# Patient Record
Sex: Male | Born: 1970 | Race: Black or African American | Hispanic: No | Marital: Married | Smoking: Heavy tobacco smoker
Health system: Southern US, Community
[De-identification: ages and names within clinical notes are randomized; demographics above are authoritative.]

## PROBLEM LIST (undated history)

## (undated) DIAGNOSIS — K219 Gastro-esophageal reflux disease without esophagitis: Secondary | ICD-10-CM

## (undated) DIAGNOSIS — I10 Essential (primary) hypertension: Secondary | ICD-10-CM

## (undated) DIAGNOSIS — T7840XA Allergy, unspecified, initial encounter: Secondary | ICD-10-CM

---

## 2005-10-24 ENCOUNTER — Ambulatory Visit: Payer: Self-pay | Admitting: Unknown Physician Specialty

## 2005-10-26 ENCOUNTER — Emergency Department: Payer: Self-pay | Admitting: Emergency Medicine

## 2005-11-12 ENCOUNTER — Emergency Department: Payer: Self-pay | Admitting: Unknown Physician Specialty

## 2005-11-14 ENCOUNTER — Emergency Department: Payer: Self-pay | Admitting: Emergency Medicine

## 2008-09-12 ENCOUNTER — Emergency Department: Payer: Self-pay | Admitting: Emergency Medicine

## 2008-09-13 ENCOUNTER — Emergency Department: Payer: Self-pay | Admitting: Emergency Medicine

## 2009-04-30 ENCOUNTER — Emergency Department: Payer: Self-pay

## 2010-06-15 IMAGING — CR DG CHEST 2V
1 series · 2 of 2 positions shown · non-contrast
Comparison: none

REASON FOR EXAM: pain with breathing
COMMENTS:

PROCEDURE:     DXR - DXR CHEST PA (OR AP) AND LATERAL  - September 12, 2008 [DATE]
RESULT:     The patient has taken a shallow inspiration.
The lungs are clear.  The cardiac silhouette and visualized bony skeleton
are unremarkable.

[Series 1: view not recorded · 0.17mm/px · 2 of 2 slices shown]
[im 1/2]
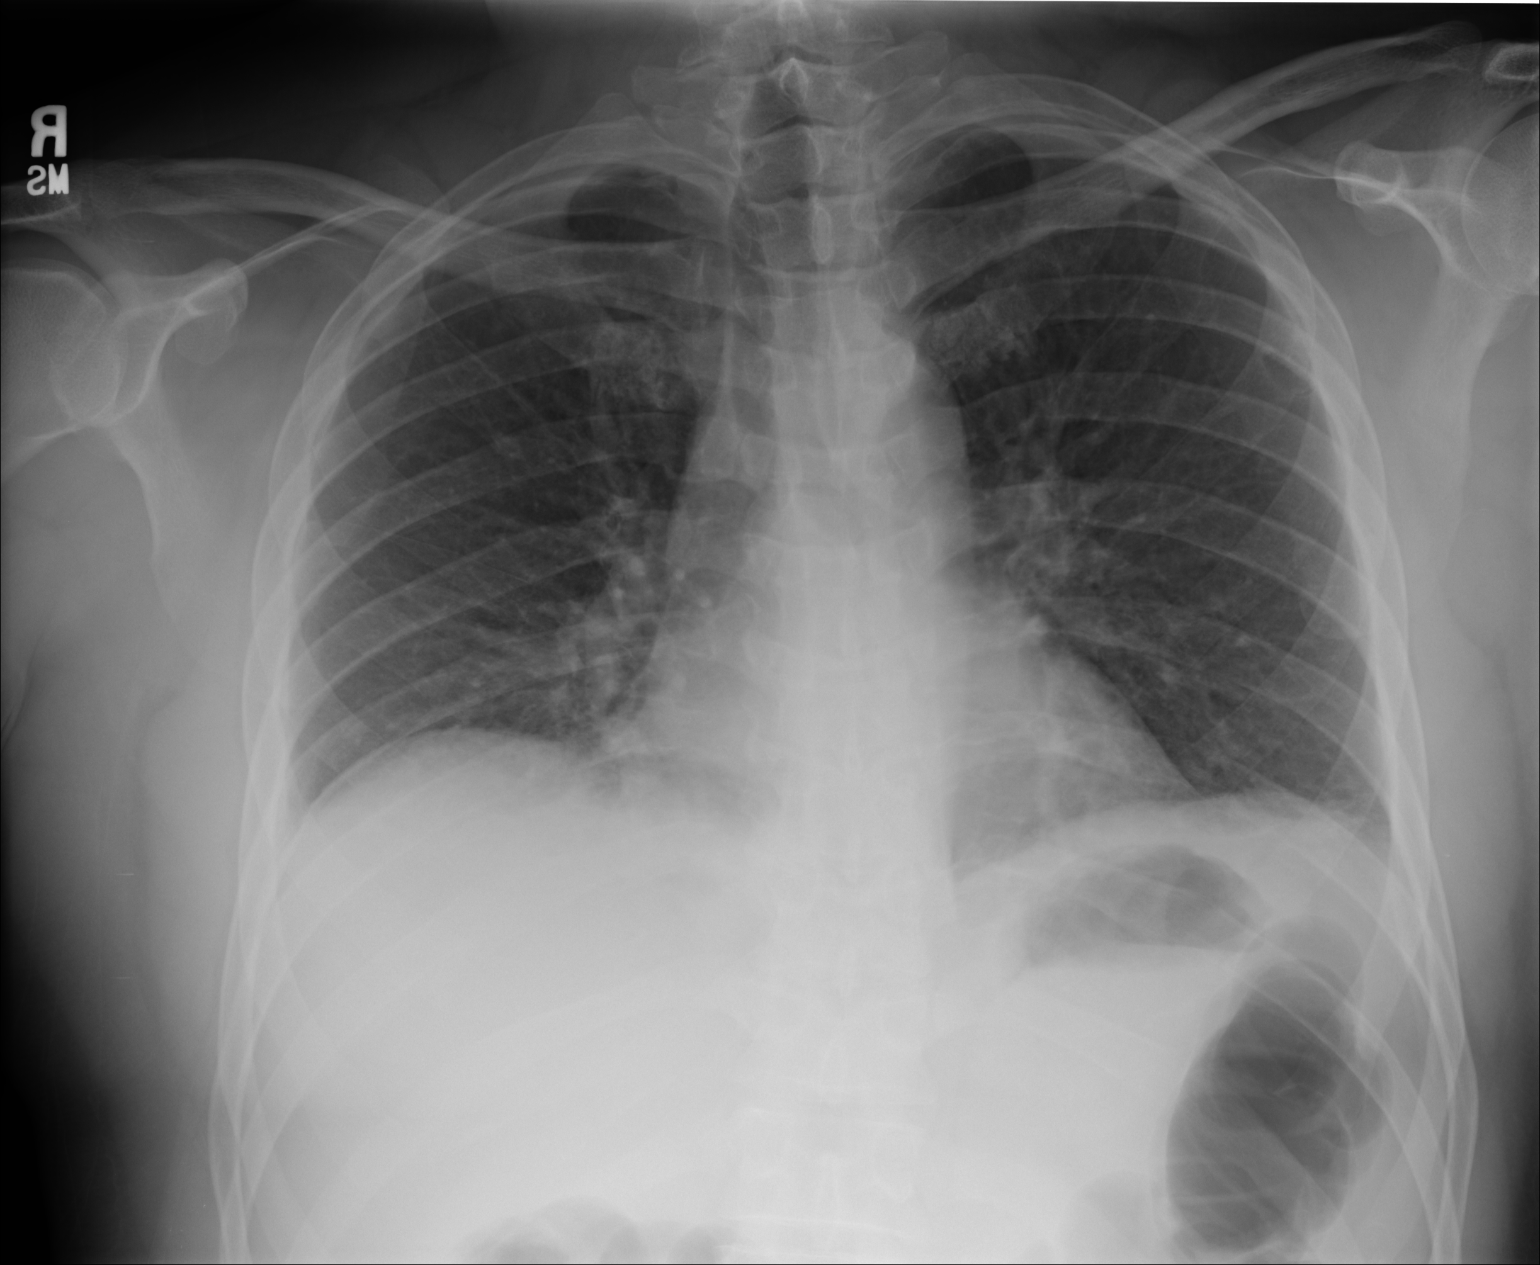
[im 2/2]
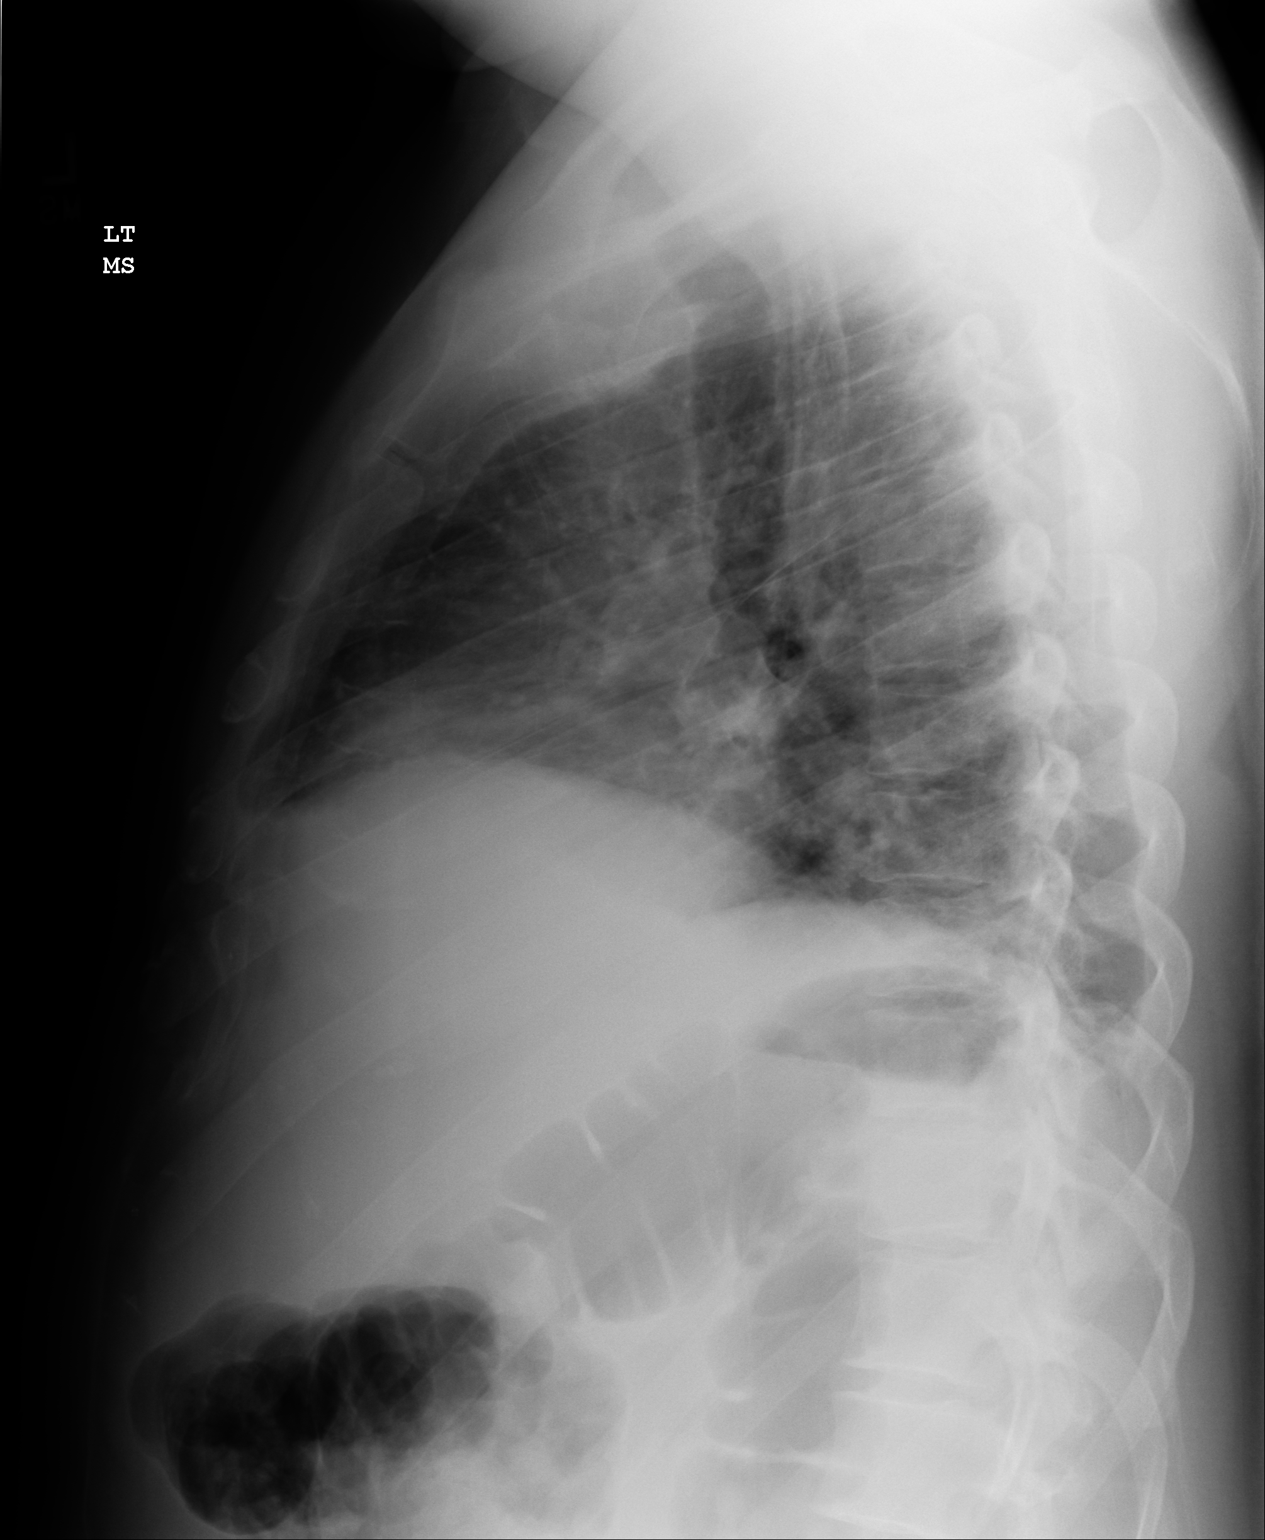

[2 of 2 positions shown; findings below may reference images not displayed]

IMPRESSION: Chest radiograph without evidence of acute cardiopulmonary disease.

## 2011-07-20 ENCOUNTER — Emergency Department: Payer: Self-pay | Admitting: Emergency Medicine

## 2011-11-12 ENCOUNTER — Emergency Department: Payer: Self-pay | Admitting: Unknown Physician Specialty

## 2011-11-12 LAB — COMPREHENSIVE METABOLIC PANEL
Albumin: 4 g/dL (ref 3.4–5.0)
Anion Gap: 9 (ref 7–16)
BUN: 19 mg/dL — ABNORMAL HIGH (ref 7–18)
Calcium, Total: 8.9 mg/dL (ref 8.5–10.1)
Chloride: 100 mmol/L (ref 98–107)
EGFR (African American): >60
EGFR (Non-African Amer.): >60
Glucose: 101 mg/dL — ABNORMAL HIGH (ref 65–99)
Potassium: 3.8 mmol/L (ref 3.5–5.1)
SGOT(AST): 70 U/L — ABNORMAL HIGH (ref 15–37)
SGPT (ALT): 78 U/L

## 2011-11-12 LAB — CBC
HCT: 43.4 % (ref 40.0–52.0)
HGB: 14.9 g/dL (ref 13.0–18.0)
MCH: 31.2 pg (ref 26.0–34.0)
MCHC: 34.3 g/dL (ref 32.0–36.0)
MCV: 91 fL (ref 80–100)
Platelet: 208 10*3/uL (ref 150–440)
RBC: 4.78 10*6/uL (ref 4.40–5.90)

## 2013-03-16 ENCOUNTER — Emergency Department: Payer: Self-pay | Admitting: Emergency Medicine

## 2013-08-14 IMAGING — CR DG CHEST 2V
1 series · 2 of 2 positions shown · non-contrast
Comparison: none

REASON FOR EXAM: sob
COMMENTS:

PROCEDURE:     DXR - DXR CHEST PA (OR AP) AND LATERAL  - November 12, 2011  [DATE]
RESULT:     Comparison: None

[Series 1: w chest pa · 0.14mm/px · 2 of 2 slices shown]
[im 1/2]
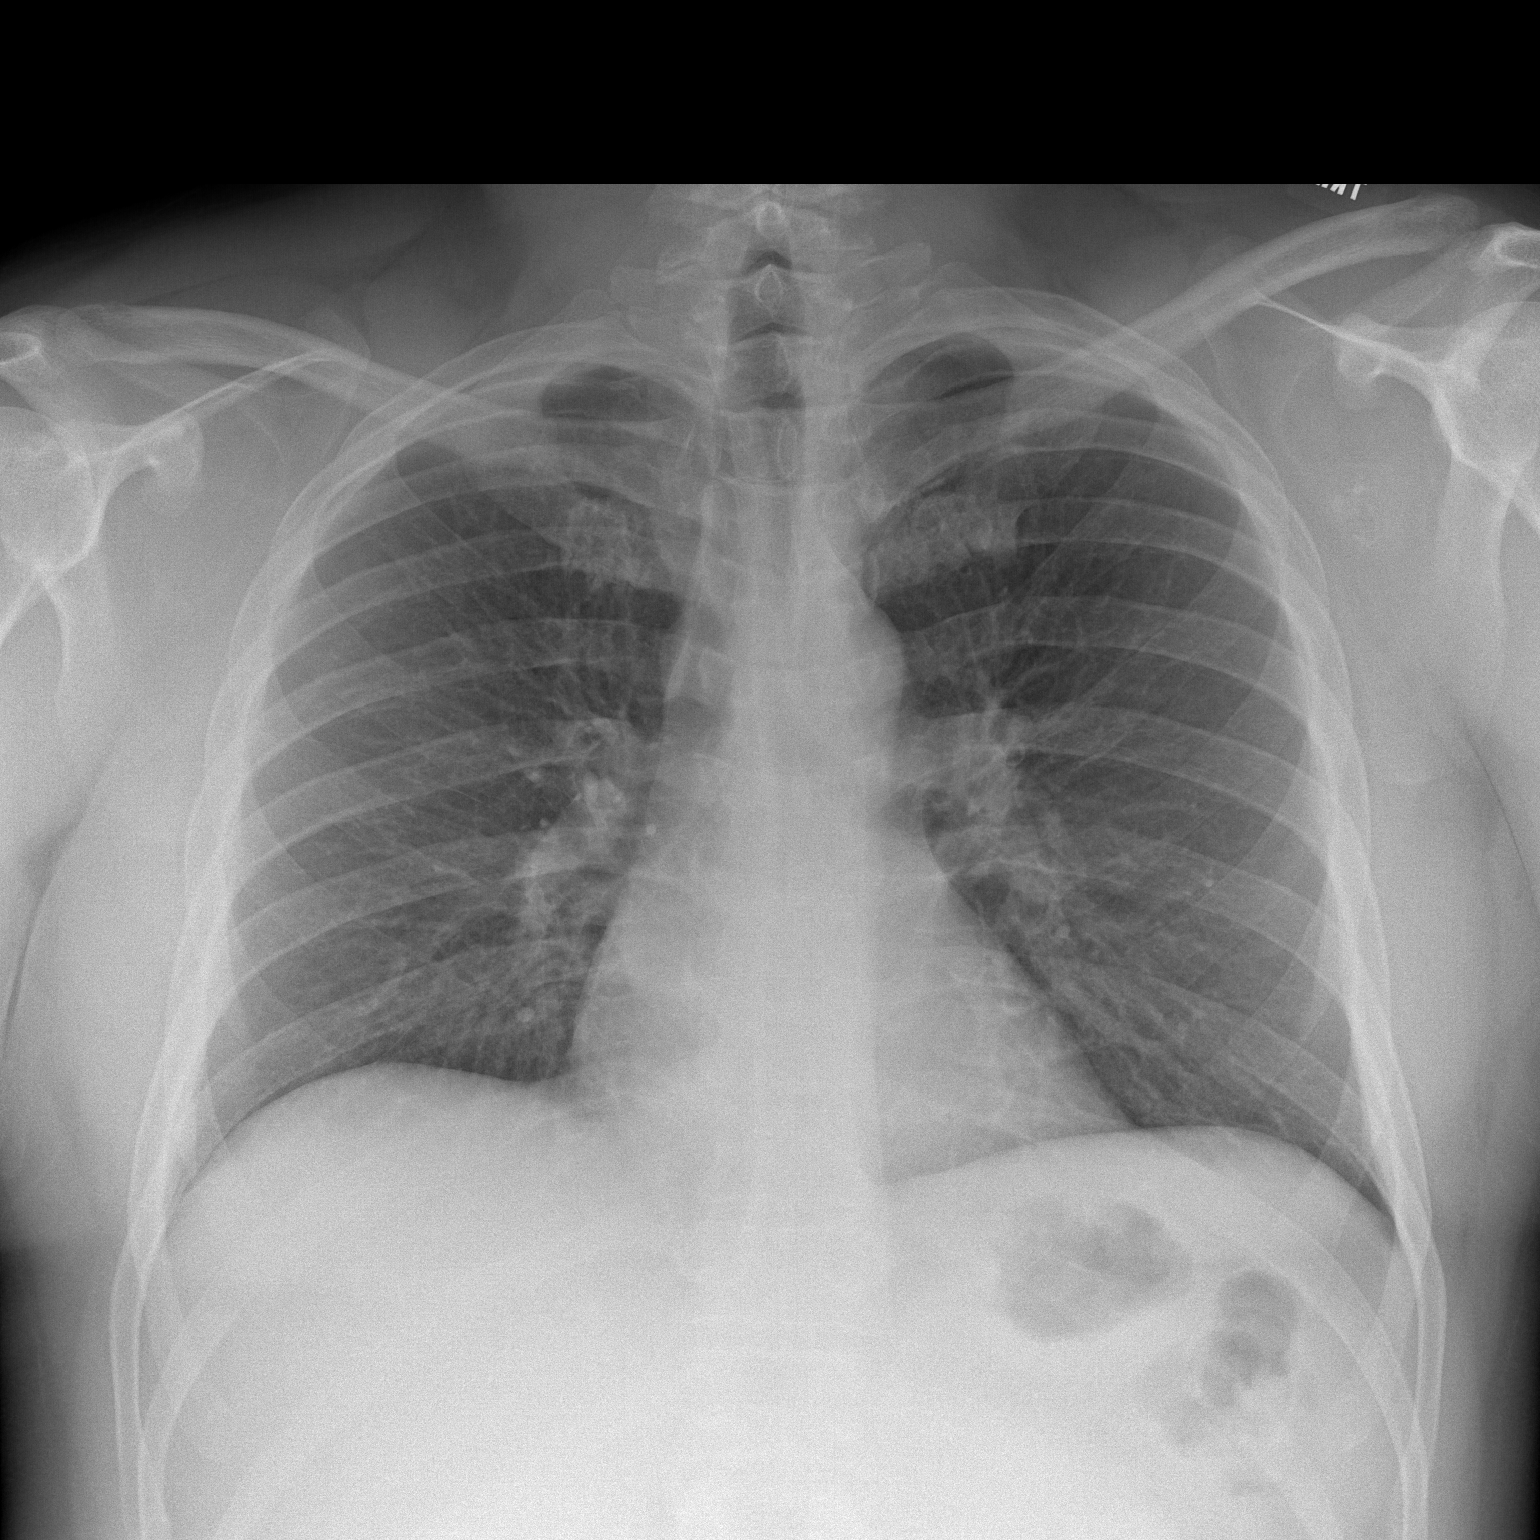
[im 2/2]
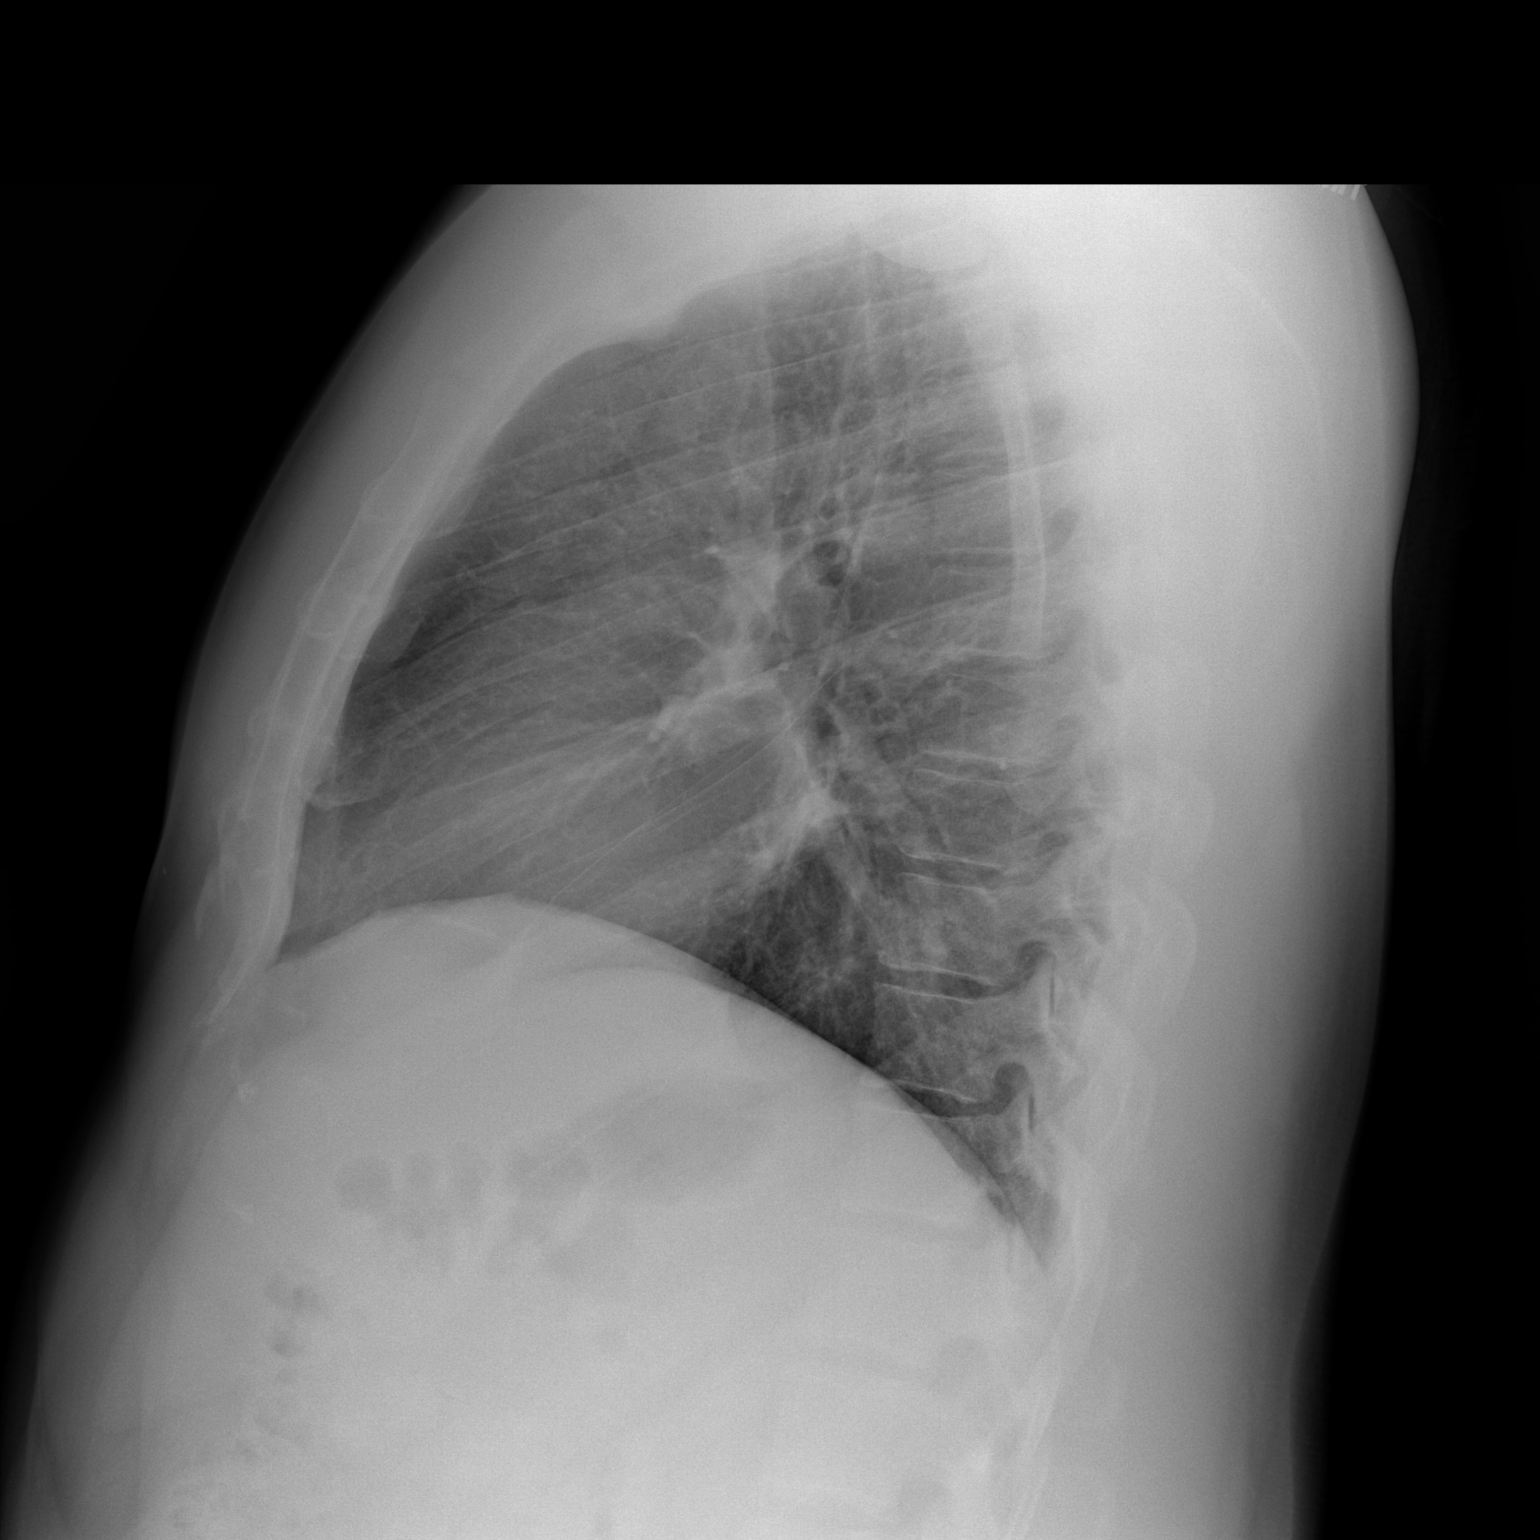

[2 of 2 positions shown; findings below may reference images not displayed]

FINDINGS: PA and lateral chest radiographs are provided.  There is no focal
parenchymal opacity, pleural effusion, or pneumothorax. The heart and
mediastinum are unremarkable.  The osseous structures are unremarkable.
IMPRESSION: No acute disease of the chest.

## 2015-04-15 ENCOUNTER — Observation Stay
Admission: EM | Admit: 2015-04-15 | Discharge: 2015-04-16 | Disposition: A | Discharge status: Home / Self Care | Payer: Self-pay | Attending: Internal Medicine | Admitting: Internal Medicine

## 2015-04-15 ENCOUNTER — Encounter: Payer: Self-pay | Admitting: Emergency Medicine

## 2015-04-15 DIAGNOSIS — K219 Gastro-esophageal reflux disease without esophagitis: Secondary | ICD-10-CM | POA: Insufficient documentation

## 2015-04-15 DIAGNOSIS — Z6836 Body mass index (BMI) 36.0-36.9, adult: Secondary | ICD-10-CM | POA: Insufficient documentation

## 2015-04-15 DIAGNOSIS — Z888 Allergy status to other drugs, medicaments and biological substances status: Secondary | ICD-10-CM | POA: Insufficient documentation

## 2015-04-15 DIAGNOSIS — T464X5A Adverse effect of angiotensin-converting-enzyme inhibitors, initial encounter: Secondary | ICD-10-CM | POA: Insufficient documentation

## 2015-04-15 DIAGNOSIS — Z79899 Other long term (current) drug therapy: Secondary | ICD-10-CM | POA: Insufficient documentation

## 2015-04-15 DIAGNOSIS — T783XXA Angioneurotic edema, initial encounter: Principal | ICD-10-CM | POA: Insufficient documentation

## 2015-04-15 DIAGNOSIS — T7840XA Allergy, unspecified, initial encounter: Secondary | ICD-10-CM | POA: Insufficient documentation

## 2015-04-15 DIAGNOSIS — F17200 Nicotine dependence, unspecified, uncomplicated: Secondary | ICD-10-CM | POA: Insufficient documentation

## 2015-04-15 DIAGNOSIS — Z8249 Family history of ischemic heart disease and other diseases of the circulatory system: Secondary | ICD-10-CM | POA: Insufficient documentation

## 2015-04-15 DIAGNOSIS — I10 Essential (primary) hypertension: Secondary | ICD-10-CM | POA: Insufficient documentation

## 2015-04-15 HISTORY — DX: Essential (primary) hypertension: I10

## 2015-04-15 HISTORY — DX: Allergy, unspecified, initial encounter: T78.40XA

## 2015-04-15 HISTORY — DX: Gastro-esophageal reflux disease without esophagitis: K21.9

## 2015-04-15 LAB — CBC
HCT: 43.1 % (ref 40.0–52.0)
Hemoglobin: 14.7 g/dL (ref 13.0–18.0)
MCH: 31.2 pg (ref 26.0–34.0)
MCHC: 34.1 g/dL (ref 32.0–36.0)
MCV: 91.5 fL (ref 80.0–100.0)
Platelets: 182 10*3/uL (ref 150–440)
RBC: 4.72 MIL/uL (ref 4.40–5.90)
RDW: 12.5 % (ref 11.5–14.5)
WBC: 4.4 10*3/uL (ref 3.8–10.6)

## 2015-04-15 LAB — COMPREHENSIVE METABOLIC PANEL
ALBUMIN: 4.3 g/dL (ref 3.5–5.0)
ALT: 97 U/L — ABNORMAL HIGH (ref 17–63)
ANION GAP: 7 (ref 5–15)
AST: 95 U/L — AB (ref 15–41)
Alkaline Phosphatase: 65 U/L (ref 38–126)
BILIRUBIN TOTAL: 0.4 mg/dL (ref 0.3–1.2)
BUN: 16 mg/dL (ref 6–20)
CALCIUM: 9.3 mg/dL (ref 8.9–10.3)
CO2: 29 mmol/L (ref 22–32)
CREATININE: 1.13 mg/dL (ref 0.61–1.24)
Chloride: 102 mmol/L (ref 101–111)
GFR calc Af Amer: >60 mL/min (ref >60)
GFR calc non Af Amer: >60 mL/min (ref >60)
Glucose, Bld: 124 mg/dL — ABNORMAL HIGH (ref 65–99)
Potassium: 3.3 mmol/L — ABNORMAL LOW (ref 3.5–5.1)
Sodium: 138 mmol/L (ref 135–145)
Total Protein: 7.4 g/dL (ref 6.5–8.1)

## 2015-04-15 MED ORDER — DIPHENHYDRAMINE HCL 50 MG/ML IJ SOLN
INTRAMUSCULAR | Status: AC
  Administered 2015-04-15: 50 mg via INTRAVENOUS
  Filled 2015-04-15: qty 1

## 2015-04-15 MED ORDER — METHYLPREDNISOLONE SODIUM SUCC 125 MG IJ SOLR
125.0000 mg | Freq: Once | INTRAMUSCULAR | Status: AC
  Administered 2015-04-15: 125 mg via INTRAVENOUS

## 2015-04-15 MED ORDER — DIPHENHYDRAMINE HCL 50 MG/ML IJ SOLN
50.0000 mg | Freq: Once | INTRAMUSCULAR | Status: AC
  Administered 2015-04-15: 50 mg via INTRAVENOUS

## 2015-04-15 MED ORDER — SODIUM CHLORIDE 0.9 % IJ SOLN
3.0000 mL | Freq: Two times a day (BID) | INTRAMUSCULAR | Status: DC
  Administered 2015-04-15 (×2): 3 mL via INTRAVENOUS

## 2015-04-15 MED ORDER — RANITIDINE HCL 150 MG/10ML PO SYRP
150.0000 mg | ORAL_SOLUTION | Freq: Two times a day (BID) | ORAL | Status: DC
  Administered 2015-04-15 – 2015-04-16 (×3): 150 mg via ORAL
  Filled 2015-04-15 (×4): qty 10

## 2015-04-15 MED ORDER — FUROSEMIDE 10 MG/ML IJ SOLN
INTRAMUSCULAR | Status: AC
  Filled 2015-04-15: qty 4

## 2015-04-15 MED ORDER — METHYLPREDNISOLONE SODIUM SUCC 40 MG IJ SOLR
40.0000 mg | Freq: Three times a day (TID) | INTRAMUSCULAR | Status: DC
Start: 2015-04-15 — End: 2015-04-16
  Administered 2015-04-15 – 2015-04-16 (×3): 40 mg via INTRAVENOUS
  Filled 2015-04-15 (×3): qty 1

## 2015-04-15 MED ORDER — ENOXAPARIN SODIUM 40 MG/0.4ML ~~LOC~~ SOLN
40.0000 mg | SUBCUTANEOUS | Status: DC
  Administered 2015-04-15: 21:00:00 40 mg via SUBCUTANEOUS
  Filled 2015-04-15: qty 0.4

## 2015-04-15 MED ORDER — PANTOPRAZOLE SODIUM 40 MG IV SOLR
40.0000 mg | Freq: Once | INTRAVENOUS | Status: AC
  Administered 2015-04-15: 40 mg via INTRAVENOUS

## 2015-04-15 MED ORDER — SODIUM CHLORIDE 0.9 % IJ SOLN: 3.0000 mL | INTRAMUSCULAR | Status: DC | PRN

## 2015-04-15 MED ORDER — ACETAMINOPHEN 325 MG PO TABS: 650.0000 mg | ORAL_TABLET | Freq: Four times a day (QID) | ORAL | Status: DC | PRN

## 2015-04-15 MED ORDER — DIPHENHYDRAMINE HCL 12.5 MG/5ML PO ELIX
25.0000 mg | ORAL_SOLUTION | Freq: Three times a day (TID) | ORAL | Status: DC
Start: 2015-04-15 — End: 2015-04-16
  Administered 2015-04-15 – 2015-04-16 (×3): 25 mg via ORAL
  Filled 2015-04-15 (×3): qty 10

## 2015-04-15 MED ORDER — HYDROCHLOROTHIAZIDE 25 MG PO TABS
ORAL_TABLET | ORAL | Status: AC
  Administered 2015-04-15: 25 mg via ORAL
  Filled 2015-04-15: qty 1

## 2015-04-15 MED ORDER — ACETAMINOPHEN 650 MG RE SUPP: 650.0000 mg | Freq: Four times a day (QID) | RECTAL | Status: DC | PRN

## 2015-04-15 MED ORDER — HYDROCHLOROTHIAZIDE 25 MG PO TABS
25.0000 mg | ORAL_TABLET | Freq: Every day | ORAL | Status: DC
  Administered 2015-04-15 – 2015-04-16 (×2): 25 mg via ORAL
  Filled 2015-04-15: qty 1

## 2015-04-15 MED ORDER — IPRATROPIUM-ALBUTEROL 0.5-2.5 (3) MG/3ML IN SOLN
RESPIRATORY_TRACT | Status: AC
  Filled 2015-04-15: qty 3

## 2015-04-15 MED ORDER — METHYLPREDNISOLONE SODIUM SUCC 125 MG IJ SOLR
INTRAMUSCULAR | Status: AC
  Administered 2015-04-15: 125 mg via INTRAVENOUS
  Filled 2015-04-15: qty 2

## 2015-04-15 MED ORDER — PANTOPRAZOLE SODIUM 40 MG IV SOLR
INTRAVENOUS | Status: AC
  Administered 2015-04-15: 40 mg via INTRAVENOUS
  Filled 2015-04-15: qty 40

## 2015-04-15 NOTE — ED Notes (Signed)
Patient present to ED with complaint of swollen tongue, since 3 am, patient has been taking Lisinopril for about 3 years, states similar symptoms occurred last year. Patient speaking in gargled voice, difficulty swallowing. Patient spitting into cup. Swollen tongue noted. Patient alert and oriented x 4, respirations even and unlabored. Call bell within reach, family at bedside.

## 2015-04-15 NOTE — ED Notes (Signed)
Pt arrived to the ED ascompanied by his wife for complaints of tong swelling. Pt takes Lisinopril. ED MD is at beside. Pt is AOx4 unable to swallow.

## 2015-04-15 NOTE — H&P (Signed)
[facility] -  at [facility]   PATIENT NAME: Jerry SkeansDonnie Rogers    MR#:  [id number]  DATE OF BIRTH:  Dec 24, 1970  DATE OF ADMISSION:  04/15/2015  PRIMARY CARE PHYSICIAN: [facility]  REQUESTING/REFERRING PHYSICIAN: Dr Scotty CourtStafford  CHIEF COMPLAINT:  Swelling of the tongue this morning.  HISTORY OF PRESENT ILLNESS:  Jerry SkeansDonnie Gebhart  is a 44 y.o. male with a known history of hypertension comes to the emergency room after he noticed a swollen tongue garbled speech and not able to swallow early hours of this morning. Patient was seen in the emergency room was found to have possible injury edema suspected secondary to his use of lisinopril. Patient has been on the Cipro for last 3 years. In the past he did have swelling of the uvula one time which was mild resolved on its own. He continued taking the Cipro thereafter as well. Denies any shortness of breath chest pain on coughing or abdominal pain. Patient received IV Solu-Medrol Protonix and Benadryl in the emergency room he is being admitted for further evaluation and management. During my evaluation patient was able to drink water. We'll place him on some full liquid diet.  PAST MEDICAL HISTORY:   Past Medical History  Diagnosis Date  . HTN (hypertension)     PAST SURGICAL HISTOIRY:  History reviewed. No pertinent past surgical history.  SOCIAL HISTORY:   History  Substance Use Topics  . Smoking status: Heavy Tobacco Smoker  . Smokeless tobacco: Not on file  . Alcohol Use: Yes    FAMILY HISTORY:   Family History  Problem Relation Age of Onset  . Hypertension Mother     DRUG ALLERGIES:   Allergies  Allergen Reactions  . Ace Inhibitors Swelling    REVIEW OF SYSTEMS:  Review of Systems  Constitutional: Negative for fever, chills and diaphoresis.  HENT: Negative for congestion, ear pain, hearing loss, nosebleeds and sore throat.        Swollen tongue  Eyes: Negative for blurred  vision, double vision, photophobia and pain.  Respiratory: Negative for hemoptysis, sputum production, wheezing and stridor.   Cardiovascular: Negative for orthopnea, claudication and leg swelling.  Gastrointestinal: Negative for heartburn and abdominal pain.  Genitourinary: Negative for dysuria and frequency.  Musculoskeletal: Negative for back pain, joint pain and neck pain.  Skin: Negative for rash.  Neurological: Negative for tingling, sensory change, speech change, focal weakness, seizures and headaches.  Endo/Heme/Allergies: Does not bruise/bleed easily.  Psychiatric/Behavioral: Negative for memory loss. The patient is not nervous/anxious.   All other systems reviewed and are negative.    MEDICATIONS AT HOME:   Prior to Admission medications   Medication Sig Start Date End Date Taking? Authorizing Provider  lisinopril-hydrochlorothiazide (PRINZIDE,ZESTORETIC) 20-25 MG per tablet Take 1 tablet by mouth 2 (two) times daily.   Yes Historical Provider, MD      VITAL SIGNS:  Blood pressure 136/92, pulse 76, height 6\' 1"  (1.854 m), weight 124.739 kg (275 lb), SpO2 97 %.  PHYSICAL EXAMINATION:  GENERAL:  44 y.o.-year-old patient lying in the bed with no acute distress. Morbid obesity EYES: Pupils equal, round, reactive to light and accommodation. No scleral icterus. Extraocular muscles intact.  HEENT: Head atraumatic, normocephalic. Tongue appears swollen and thick. No drooling of secretions NECK:  Supple, no jugular venous distention. No thyroid enlargement, no tenderness.  LUNGS: Normal breath sounds bilaterally, no wheezing, rales,rhonchi or crepitation. No use of accessory muscles of respiration.  CARDIOVASCULAR: S1, S2 normal. No murmurs,  rubs, or gallops.  ABDOMEN: Soft, nontender, nondistended. Bowel sounds present. No organomegaly or mass.  EXTREMITIES: No pedal edema, cyanosis, or clubbing.  NEUROLOGIC: Cranial nerves II through XII are intact. Muscle strength 5/5 in all  extremities. Sensation intact. Gait not checked.  PSYCHIATRIC: The patient is alert and oriented x 3.  SKIN: No obvious rash, lesion, or ulcer.   LABORATORY PANEL:   CBC  Recent Labs Lab 04/15/15 0711  WBC 4.4  HGB 14.7  HCT 43.1  PLT 182   ------------------------------------------------------------------------------------------------------------------  Chemistries   Recent Labs Lab 04/15/15 0711  NA 138  K 3.3*  CL 102  CO2 29  GLUCOSE 124*  BUN 16  CREATININE 1.13  CALCIUM 9.3  AST 95*  ALT 97*  ALKPHOS 65  BILITOT 0.4   IMPRESSION AND PLAN:   44 year old Mr. Jerry Rogers with past medical history of hypertension comes in the emergency room with  #1 Angioedema suspected due to lisinopril. Admit patient to medical floor. Watch for respiratory distress and and/or stridor. Continue IV Solu-Medrol, IV ranitidine and, by mouth Benadryl. Full liquid diet advance to soft as patient able to tolerate. Watch for respiratory compromise. Lisinopril will be listed as allergy.  #2 hypertension. Patient's blood pressure is stable. We'll continue hydrochlorothiazide. Add another antihypertensive if needed  #3 DVT prophylaxis Subcutaneous Lovenox.   Above was discussed with patient and patient's family.  All the records are reviewed and case discussed with ED provider. Management plans discussed with the patient, family and they are in agreement.  CODE STATUS: Full  TOTAL TIME TAKING CARE OF THIS PATIENT: 45 minutes.    Pacen Watford M.D on 04/15/2015 at 9:39 AM  Between 7am to 6pm - Pager - [phone]  After 6pm go to [email or web address] - password [email or web address] [facility]  [place] [facility]  Office  [phone]  CC: Primary care physician : [facility]

## 2015-04-15 NOTE — ED Provider Notes (Signed)
-----------------------------------------   8:37 AM on 04/15/2015 -----------------------------------------  Received signout from Dr. Ladona Ridgelaylor at 7:00 AM. I examined the patient at 7:10 AM. Noted significant swelling of the tongue and sublingual space in the floor of the mouth. Patient's voice is muffled, but respiration is not stridorous, and unlabored. He is managing his secretions appropriately. He was given antihistamines and steroids.   On reexamination at 8:30 AM, the patient still has significant lingual and sublingual swelling. He does feel slightly improved, but on exam the improvement is minimal. I discussed his case with hospitalist Dr. Enedina FinnerSona Patel who will evaluate the patient for admission to the hospital for further monitoring for potential airway compromise. Patient has been counseled to never take an ACE inhibitor or ARB again.    Jerry CheekPhillip Zamiyah Resendes, MD 04/15/15 [id number]

## 2015-04-15 NOTE — ED Provider Notes (Signed)
[facility] Emergency Department Provider Note ___________________________________________  Time seen: Approximately 6:43 AM  I have reviewed the triage vital signs and the nursing notes.   HISTORY  Chief Complaint Oral Swelling  HPI Jerry Rogers is a 44 y.o. male who has been taking lisinopril mixed with hydrochlorothiazide for at least 3 years and woke up this morning with significant tongue and posterior oral pharyngeal swelling. Patient was able to swallow some of his spit but was given gag been having to spit some of it in a cup. Patient stated that he's had a couple episodes where his uvula was swollen and it was during the time that he was taking lisinopril but he was never taken off. Patient denies any difficulty breathing or any other significant facial swelling at this time. Patient also denies any recent illnesses, fever chills, headache, nausea vomiting or abdominal pain. Patient woke this morning with the symptoms and he denies any pain at this time.   Past Medical History  Diagnosis Date  . HTN (hypertension)     There are no active problems to display for this patient.   History reviewed. No pertinent past surgical history.  No current outpatient prescriptions on file.  Allergies Review of patient's allergies indicates no known allergies.  History reviewed. No pertinent family history.  Social History History  Substance Use Topics  . Smoking status: Heavy Tobacco Smoker  . Smokeless tobacco: Not on file  . Alcohol Use: Yes    Review of Systems Constitutional: No fever/chills Eyes: No visual changes. ENT: No sore throat. Tongue and throat swelling since this morning Cardiovascular: Denies chest pain. Respiratory: Denies shortness of breath. Gastrointestinal: No abdominal pain.  No nausea, no vomiting.  No diarrhea.  No constipation. Genitourinary: Negative for dysuria. Musculoskeletal: Negative for back pain. Skin: Negative  for rash. Neurological: Negative for headaches, focal weakness or numbness.  10-point ROS otherwise negative.  ____________________________________________   PHYSICAL EXAM:  VITAL SIGNS: ED Triage Vitals  Enc Vitals Group     BP --      Pulse --      Resp --      Temp --      Temp src --      SpO2 --      Weight 04/15/15 0637 275 lb (124.739 kg)     Height 04/15/15 0637 6\' 1"  (1.854 m)     Head Cir --      Peak Flow --      Pain Score --      Pain Loc --      Pain Edu? --      Excl. in GC? --     Constitutional: Alert and oriented. Well appearing and in no acute distress. Eyes: Conjunctivae are normal. PERRL. EOMI. Head: Atraumatic. No other significant facial swelling Nose: No congestion/rhinnorhea. Mouth/Throat: Mucous membranes are moist.  Oropharynx non-erythematous. Patient with fairly significant swelling to the tongue as well as some mild swelling to his posterior oropharynx and uvula. Patient was spitting some of his spit cup but was able to swallow his spit at times. Neck: No stridor.   Cardiovascular: Normal rate, regular rhythm. Grossly normal heart sounds.  Good peripheral circulation. Respiratory: Normal respiratory effort.  No retractions. Lungs CTAB. With no wheezing Gastrointestinal: Soft and nontender. No distention. No abdominal bruits. No CVA tenderness. Musculoskeletal: No lower extremity tenderness nor edema.  No joint effusions. Neurologic:  Normal speech and language. No gross focal neurologic deficits are appreciated. Speech  is normal. No gait instability. Skin:  Skin is warm, dry and intact. No rash noted. Psychiatric: Mood and affect are normal. Speech and behavior are normal.  ____________________________________________   LABS (all labs ordered are listed, but only abnormal results are displayed)  Labs Reviewed - No data to  display ____________________________________________  EKG  None ____________________________________________  RADIOLOGY   ____________________________________________   PROCEDURES  Procedure(s) performed: None  Critical Care performed: No  ____________________________________________   INITIAL IMPRESSION / ASSESSMENT AND PLAN / ED COURSE  Pertinent labs & imaging results that were available during my care of the patient were reviewed by me and considered in my medical decision making (see chart for details).  ----------------------------------------- 6:52 AM on 04/15/2015 -----------------------------------------  Patient was immediately given some IV Solu-Medrol, IV Protonix, and IV Benadryl in the ED. Patient was also given some ice chips to sit on his tongue and some for his throat swelling. Of course we will stop the patient from using lisinopril and patient will be signed out to Dr. Scotty Court at 7 AM. ____________________________________________   FINAL CLINICAL IMPRESSION(S) / ED DIAGNOSES  Final diagnoses:  Acute allergic reaction, initial encounter  ACE inhibitor-aggravated angioedema, initial encounter      Leona Carry, MD 04/15/15 [phone]

## 2015-04-15 NOTE — Plan of Care (Signed)
Problem: Discharge Progression Outcomes Goal: Pain controlled with appropriate interventions Outcome: Not Applicable Date Met:  27/74/12 Denies pain Goal: Hemodynamically stable Outcome: Progressing Swelling of face and throat improved since admission to ed this am. Goal: Tolerating diet Outcome: Progressing Able to tol liquids on Full Liquid diet Goal: Activity appropriate for discharge plan Outcome: Progressing Up ad Lib Goal: Other Discharge Outcomes/Goals Outcome: Progressing Plan to discharge home with mother

## 2015-04-16 MED ORDER — DIPHENHYDRAMINE HCL 25 MG PO CAPS: 25.0000 mg | ORAL_CAPSULE | Freq: Four times a day (QID) | ORAL | Status: AC | PRN

## 2015-04-16 MED ORDER — RANITIDINE HCL 150 MG PO TABS: 150.0000 mg | ORAL_TABLET | Freq: Two times a day (BID) | ORAL | Status: AC

## 2015-04-16 MED ORDER — HYDROCHLOROTHIAZIDE 25 MG PO TABS
25.0000 mg | ORAL_TABLET | Freq: Every day | ORAL | Status: AC
Start: 2015-04-16 — End: ?

## 2015-04-16 NOTE — Progress Notes (Signed)
Pt for discharge home. Alert. Edema resolving. No problems  With  Eating or swallowing . No resp distress.discharge instructions discussed with pt.sl d/cd.  Denies pain.  No resp distress.

## 2015-04-16 NOTE — Discharge Instructions (Signed)
Keep log of your BP readings at home

## 2015-04-16 NOTE — Plan of Care (Signed)
Problem: Discharge Progression Outcomes Goal: Discharge plan in place and appropriate Individualization :From home/ Lives with Mother.Currently not working d/t inability to find work. Pt had been on Lisinopril x 3 years.Woke up with face and throat swollen. Inability to swallow at the time.Hx of htn. Controlled With med.  Outcome: Progressing Pt lives at home with wife Has been on lisinopril for 3 years, woke up with face and throat swollen Hx of htn, controlled with meds.    Goal: Other Discharge Outcomes/Goals Outcome: Progressing Plan of care progress to goal: Hemodynamically stable - vitals stable this shift Complications - pt here for observation, face looks appropriate and not swollen Diet - pt on full liquid diet and tolerating Activity - pt up ad lib

## 2015-04-16 NOTE — Discharge Summary (Signed)
[facility] - [place] at [facility]   PATIENT NAME: Jerry Rogers    MR#:  [id number]  DATE OF BIRTH:  October 15, 1970  DATE OF ADMISSION:  04/15/2015 ADMITTING PHYSICIAN: Enedina Finner, MD  DATE OF DISCHARGE: 04/16/15  PRIMARY CARE PHYSICIAN: No PCP Per Patient    ADMISSION DIAGNOSIS:  ACE inhibitor-aggravated angioedema, initial encounter [T78.3XXA, T46.4X5A] Acute allergic reaction, initial encounter [T78.40XA]  DISCHARGE DIAGNOSIS:  Acute Angioedema due to Lisinopril  SECONDARY DIAGNOSIS:   Past Medical History  Diagnosis Date  . HTN (hypertension)   . GERD (gastroesophageal reflux disease)   . Allergy     HOSPITAL COURSE:   44 year old Jerry Rogers with past medical history of hypertension comes in the emergency room with  #1 Angioedema suspected due to lisinopril. -recieved IV Solu-Medrol, IV ranitidine and Benadryl.-symptoms resolved -tolerating soft diet Lisinopril  listed as allergy.  #2 hypertension. Patient's blood pressure is stable. We'll continue hydrochlorothiazide. Add another antihypertensive if needed  #3 DVT prophylaxis Subcutaneous Lovenox.   Overall at baseline. D/c to home  DISCHARGE CONDITIONS:   fair CONSULTS OBTAINED:   none  DRUG ALLERGIES:   Allergies  Allergen Reactions  . Ace Inhibitors Swelling  . Lisinopril Swelling    DISCHARGE MEDICATIONS:   Current Discharge Medication List    START taking these medications   Details  diphenhydrAMINE (BENADRYL) 25 mg capsule Take 1 capsule (25 mg total) by mouth every 6 (six) hours as needed. Qty: 30 capsule, Refills: 0    hydrochlorothiazide (HYDRODIURIL) 25 MG tablet Take 1 tablet (25 mg total) by mouth daily. Qty: 30 tablet, Refills: 0    ranitidine (ZANTAC) 150 MG tablet Take 1 tablet (150 mg total) by mouth 2 (two) times daily. Qty: 30 tablet, Refills: 0      STOP taking these medications     lisinopril-hydrochlorothiazide (PRINZIDE,ZESTORETIC) 20-25  MG per tablet         If you experience worsening of your admission symptoms, develop shortness of breath, life threatening emergency, suicidal or homicidal thoughts you must seek medical attention immediately by calling 911 or calling your MD immediately  if symptoms less severe.  You Must read complete instructions/literature along with all the possible adverse reactions/side effects for all the Medicines you take and that have been prescribed to you. Take any new Medicines after you have completely understood and accept all the possible adverse reactions/side effects.   Please note  You were cared for by a hospitalist during your hospital stay. If you have any questions about your discharge medications or the care you received while you were in the hospital after you are discharged, you can call the unit and asked to speak with the hospitalist on call if the hospitalist that took care of you is not available. Once you are discharged, your primary care physician will handle any further medical issues. Please note that NO REFILLS for any discharge medications will be authorized once you are discharged, as it is imperative that you return to your primary care physician (or establish a relationship with a primary care physician if you do not have one) for your aftercare needs so that they can reassess your need for medications and monitor your lab values. Today   SUBJECTIVE   No complaints.   VITAL SIGNS:  Blood pressure 150/87, pulse 77, temperature 97.6 F (36.4 C), temperature source Oral, resp. rate 20, height 6' (1.829 m), weight 122.528 kg (270 lb 2 oz), SpO2 96 %.  I/O:  Intake/Output Summary (Last 24 hours) at 04/16/15 0836 Last data filed at 04/15/15 1800  Gross per 24 hour  Intake   1480 ml  Output      0 ml  Net   1480 ml    PHYSICAL EXAMINATION:  GENERAL:  44 y.o.-year-old patient lying in the bed with no acute distress.  EYES: Pupils equal, round, reactive to light and  accommodation. No scleral icterus. Extraocular muscles intact.  HEENT: Head atraumatic, normocephalic. Oropharynx and nasopharynx clear. Tongue back to normal NECK:  Supple, no jugular venous distention. No thyroid enlargement, no tenderness.  LUNGS: Normal breath sounds bilaterally, no wheezing, rales,rhonchi or crepitation. No use of accessory muscles of respiration.  CARDIOVASCULAR: S1, S2 normal. No murmurs, rubs, or gallops.  ABDOMEN: Soft, non-tender, non-distended. Bowel sounds present. No organomegaly or mass.  EXTREMITIES: No pedal edema, cyanosis, or clubbing.  NEUROLOGIC: Cranial nerves II through XII are intact. Muscle strength 5/5 in all extremities. Sensation intact. Gait not checked.  PSYCHIATRIC: The patient is alert and oriented x 3.  SKIN: No obvious rash, lesion, or ulcer.   DATA REVIEW:   CBC   Recent Labs Lab 04/15/15 0711  WBC 4.4  HGB 14.7  HCT 43.1  PLT 182    Chemistries   Recent Labs Lab 04/15/15 0711  NA 138  K 3.3*  CL 102  CO2 29  GLUCOSE 124*  BUN 16  CREATININE 1.13  CALCIUM 9.3  AST 95*  ALT 97*  ALKPHOS 65  BILITOT 0.4   Management plans discussed with the patient, family and they are in agreement.  CODE STATUS:     Code Status Orders        Start     Ordered   04/15/15 1037  Full code   Continuous     04/15/15 1036     TOTAL TIME TAKING CARE OF THIS PATIENT: 40 minutes.   Jerry Rogers M.D on 04/16/2015 at 8:36 AM  Between 7am to 6pm - Pager - [phone] After 6pm go to [email or web address] - password [email or web address] [facility]  [facility]  Office  [phone]  CC: Primary care physician: [facility]

## 2015-07-24 ENCOUNTER — Emergency Department
Admission: EM | Admit: 2015-07-24 | Discharge: 2015-07-24 | Disposition: A | Discharge status: Home / Self Care | Payer: Medicaid Other | Attending: Emergency Medicine | Admitting: Emergency Medicine

## 2015-07-24 DIAGNOSIS — Z720 Tobacco use: Secondary | ICD-10-CM | POA: Insufficient documentation

## 2015-07-24 DIAGNOSIS — Z79899 Other long term (current) drug therapy: Secondary | ICD-10-CM | POA: Diagnosis not present

## 2015-07-24 DIAGNOSIS — T7840XA Allergy, unspecified, initial encounter: Secondary | ICD-10-CM | POA: Diagnosis present

## 2015-07-24 DIAGNOSIS — T783XXA Angioneurotic edema, initial encounter: Secondary | ICD-10-CM | POA: Insufficient documentation

## 2015-07-24 DIAGNOSIS — I10 Essential (primary) hypertension: Secondary | ICD-10-CM | POA: Insufficient documentation

## 2015-07-24 MED ORDER — DEXAMETHASONE SODIUM PHOSPHATE 10 MG/ML IJ SOLN
10.0000 mg | Freq: Once | INTRAMUSCULAR | Status: AC
  Administered 2015-07-24: 10 mg via INTRAVENOUS
  Filled 2015-07-24: qty 1

## 2015-07-24 MED ORDER — FAMOTIDINE IN NACL 20-0.9 MG/50ML-% IV SOLN
20.0000 mg | Freq: Once | INTRAVENOUS | Status: AC
  Administered 2015-07-24: 20 mg via INTRAVENOUS
  Filled 2015-07-24: qty 50

## 2015-07-24 MED ORDER — EPINEPHRINE 0.3 MG/0.3ML IJ SOAJ: 0.3000 mg | Freq: Once | INTRAMUSCULAR | Status: AC

## 2015-07-24 MED ORDER — PREDNISONE 20 MG PO TABS: 20.0000 mg | ORAL_TABLET | Freq: Every day | ORAL | Status: AC

## 2015-07-24 MED ORDER — DIPHENHYDRAMINE HCL 50 MG/ML IJ SOLN: 50.0000 mg | Freq: Once | INTRAMUSCULAR | Status: DC

## 2015-07-24 NOTE — ED Notes (Signed)
Pt reports tongue still feels swollen, denies trouble swallowing or breathing. No increased work in breathing noted.

## 2015-07-24 NOTE — ED Notes (Signed)
Pt states he woke up and his tongue was swollen. (noted right sided swelling) pt denies difficulty swallowing or breathing.

## 2015-07-24 NOTE — ED Notes (Signed)
Pt presents to ED with c/o tongue swelling. Pt reports stopping Lisinopril about a month ago for the same, and was switched to Amlodipine. Pt reports waking this morning and going to the BR, states he had a dry mouth and noticed his tongue was swollen when he looked. Pt denies any issues with breathing or swallowing; denies any issues with managing saliva or secretions.

## 2015-07-24 NOTE — Discharge Instructions (Signed)
Angioedema  Angioedema is a sudden swelling of tissues, often of the skin. It can occur on the face or genitals or in the abdomen or other body parts. The swelling usually develops over a short period and gets better in 24 to 48 hours. It often begins during the night and is found when the person wakes up. The person may also get red, itchy patches of skin (hives). Angioedema can be dangerous if it involves swelling of the air passages.   Depending on the cause, episodes of angioedema may only happen once, come back in unpredictable patterns, or repeat for several years and then gradually fade away.   CAUSES   Angioedema can be caused by an allergic reaction to various triggers. It can also result from nonallergic causes, including reactions to drugs, immune system disorders, viral infections, or an abnormal gene that is passed to you from your parents (hereditary). For some people with angioedema, the cause is unknown.   Some things that can trigger angioedema include:    Foods.    Medicines, such as ACE inhibitors, ARBs, nonsteroidal anti-inflammatory agents, or estrogen.    Latex.    Animal saliva.    Insect stings.    Dyes used in X-rays.    Mild injury.    Dental work.   Surgery.   Stress.    Sudden changes in temperature.    Exercise.  SIGNS AND SYMPTOMS    Swelling of the skin.   Hives. If these are present, there is also intense itching.   Redness in the affected area.    Pain in the affected area.   Swollen lips or tongue.   Breathing problems. This may happen if the air passages swell.   Wheezing.  If internal organs are involved, there may be:    Nausea.    Abdominal pain.    Vomiting.    Difficulty swallowing.    Difficulty passing urine.  DIAGNOSIS    Your health care provider will examine the affected area and take a medical and family history.   Various tests may be done to help determine the cause. Tests may include:   Allergy skin tests to see if the problem  is an allergic reaction.    Blood tests to check for hereditary angioedema.    Tests to check for underlying diseases that could cause the condition.    A review of your medicines, including over-the-counter medicines, may be done.  TREATMENT   Treatment will depend on the cause of the angioedema. Possible treatments include:    Removal of anything that triggered the condition (such as stopping certain medicines).    Medicines to treat symptoms or prevent attacks. Medicines given may include:     Antihistamines.     Epinephrine injection.     Steroids.    Hospitalization may be required for severe attacks. If the air passages are affected, it can be an emergency. Tubes may need to be placed to keep the airway open.  HOME CARE INSTRUCTIONS    Take all medicines as directed by your health care provider.   If you were given medicines for emergency allergy treatment, always carry them with you.   Wear a medical bracelet as directed by your health care provider.    Avoid known triggers.  SEEK MEDICAL CARE IF:    You have repeat attacks of angioedema.    Your attacks are more frequent or more severe despite preventive measures.    You have hereditary angioedema   and are considering having children. It is important to discuss with your health care provider the risks of passing the condition on to your children.  SEEK IMMEDIATE MEDICAL CARE IF:    You have severe swelling of the mouth, tongue, or lips.   You have difficulty breathing.    You have difficulty swallowing.    You faint.  MAKE SURE YOU:   Understand these instructions.   Will watch your condition.   Will get help right away if you are not doing well or get worse.     This information is not intended to replace advice given to you by your health care provider. Make sure you discuss any questions you have with your health care provider.     Document Released: 12/05/2001 Document Revised: 10/17/2014 Document Reviewed:  05/20/2013  [employer]vier Interactive Patient Education 2016 [employer]

## 2015-07-24 NOTE — ED Provider Notes (Signed)
[facility] Emergency Department Provider Note  ____________________________________________  Time seen: 2:40 AM  I have reviewed the triage vital signs and the nursing notes.   HISTORY  Chief Complaint Allergic Reaction     HPI Jerry Rogers is a 44 y.o. male presents with acute onset of right-sided tongue swelling approximately 30 minutes prior to presentation to emergency department. Patient states that he awoke with a hot sensation on his tongue and realized that it was swollen. Patient denies any difficulty breathing no difficulty swallowing no change in his voice. Of note patient had a history approximately one month ago that was similar to this but at that time he was taking lisinopril he is since discontinued taking lisinopril.     Past Medical History  Diagnosis Date  . HTN (hypertension)   . GERD (gastroesophageal reflux disease)   . Allergy     Patient Active Problem List   Diagnosis Date Noted  . Angioedema 04/15/2015    History reviewed. No pertinent past surgical history.  Current Outpatient Rx  Name  Route  Sig  Dispense  Refill  . AMLODIPINE BESYLATE PO   Oral   Take by mouth daily.         . diphenhydrAMINE (BENADRYL) 25 mg capsule   Oral   Take 1 capsule (25 mg total) by mouth every 6 (six) hours as needed.   30 capsule   0   . hydrochlorothiazide (HYDRODIURIL) 25 MG tablet   Oral   Take 1 tablet (25 mg total) by mouth daily.   30 tablet   0   . ranitidine (ZANTAC) 150 MG tablet   Oral   Take 1 tablet (150 mg total) by mouth 2 (two) times daily.   30 tablet   0     Allergies Ace inhibitors and Lisinopril  Family History  Problem Relation Age of Onset  . Hypertension Mother     Social History Social History  Substance Use Topics  . Smoking status: Heavy Tobacco Smoker  . Smokeless tobacco: None  . Alcohol Use: Yes    Review of Systems  Constitutional: Negative for fever. Eyes: Negative for  visual changes. ENT: Negative for sore throat. Positive tongue swelling Cardiovascular: Negative for chest pain. Respiratory: Negative for shortness of breath. Gastrointestinal: Negative for abdominal pain, vomiting and diarrhea. Genitourinary: Negative for dysuria. Musculoskeletal: Negative for back pain. Skin: Negative for rash. Neurological: Negative for headaches, focal weakness or numbness.   10-point ROS otherwise negative.  ____________________________________________   PHYSICAL EXAM:  VITAL SIGNS: ED Triage Vitals  Enc Vitals Group     BP 07/24/15 0240 152/94 mmHg     Pulse Rate 07/24/15 0240 84     Resp 07/24/15 0240 16     Temp 07/24/15 0240 98.5 F (36.9 C)     Temp Source 07/24/15 0240 Oral     SpO2 07/24/15 0240 96 %     Weight 07/24/15 0240 270 lb (122.471 kg)     Height 07/24/15 0240 6' (1.829 m)     Head Cir --      Peak Flow --      Pain Score 07/24/15 0250 0     Pain Loc --      Pain Edu? --      Excl. in GC? --      Constitutional: Alert and oriented. Well appearing and in no distress. Eyes: Conjunctivae are normal. PERRL. Normal extraocular movements. ENT   Head: Normocephalic and atraumatic.  Nose: No congestion/rhinnorhea.   Mouth/Throat: Mucous membranes are moist.   Neck: No stridor. Cardiovascular: Normal rate, regular rhythm. Normal and symmetric distal pulses are present in all extremities. No murmurs, rubs, or gallops. Respiratory: Normal respiratory effort without tachypnea nor retractions. Breath sounds are clear and equal bilaterally. No wheezes/rales/rhonchi. Gastrointestinal: Soft and nontender. No distention. There is no CVA tenderness. Genitourinary: deferred Musculoskeletal: Nontender with normal range of motion in all extremities. No joint effusions.  No lower extremity tenderness nor edema. Neurologic:  Normal speech and language. No gross focal neurologic deficits are appreciated. Speech is normal.  Skin:  Skin is  warm, dry and intact. No rash noted. Psychiatric: Mood and affect are normal. Speech and behavior are normal. Patient exhibits appropriate insight and judgment.      INITIAL IMPRESSION / ASSESSMENT AND PLAN / ED COURSE  Pertinent labs & imaging results that were available during my care of the patient were reviewed by me and considered in my medical decision making (see chart for details).  Patient received Decadron 10 mg and Pepcid. Patient 50 mg of Benadryl before presentation to emergency department. ----------------------------------------- 6:46 AM on 07/24/2015 -----------------------------------------  Patient reevaluated and states that he has no difficulty breathing no difficulty swallowing and the swelling in this time has improved.  ____________________________________________   FINAL CLINICAL IMPRESSION(S) / ED DIAGNOSES  Final diagnoses:  Angioedema, initial encounter      Darci Currentandolph N Brown, MD 07/24/15 [phone]

## 2015-07-24 NOTE — ED Notes (Signed)

## 2015-09-23 ENCOUNTER — Ambulatory Visit: Payer: Medicaid Other | Attending: Otolaryngology

## 2015-09-23 DIAGNOSIS — E669 Obesity, unspecified: Secondary | ICD-10-CM | POA: Insufficient documentation

## 2015-09-23 DIAGNOSIS — I10 Essential (primary) hypertension: Secondary | ICD-10-CM | POA: Diagnosis present

## 2015-09-23 DIAGNOSIS — G4733 Obstructive sleep apnea (adult) (pediatric): Secondary | ICD-10-CM | POA: Insufficient documentation

## 2015-09-23 DIAGNOSIS — R0683 Snoring: Secondary | ICD-10-CM | POA: Diagnosis present
